# Patient Record
Sex: Female | Born: 1970 | Race: Black or African American | Hispanic: No | Marital: Married | Smoking: Never smoker
Health system: Southern US, Community
[De-identification: ages and names within clinical notes are randomized; demographics above are authoritative.]

## PROBLEM LIST (undated history)

## (undated) DIAGNOSIS — E079 Disorder of thyroid, unspecified: Secondary | ICD-10-CM

## (undated) HISTORY — PX: ABDOMINAL HYSTERECTOMY: SHX81

---

## 2005-12-12 ENCOUNTER — Inpatient Hospital Stay (HOSPITAL_COMMUNITY): Admission: RE | Admit: 2005-12-12 | Discharge: 2005-12-14 | Payer: Self-pay | Admitting: *Deleted

## 2009-07-08 ENCOUNTER — Encounter (INDEPENDENT_AMBULATORY_CARE_PROVIDER_SITE_OTHER): Payer: Self-pay | Admitting: Gynecology

## 2009-07-08 ENCOUNTER — Ambulatory Visit (HOSPITAL_BASED_OUTPATIENT_CLINIC_OR_DEPARTMENT_OTHER): Admission: RE | Admit: 2009-07-08 | Discharge: 2009-07-09 | Payer: Self-pay | Admitting: Gynecology

## 2009-11-15 ENCOUNTER — Emergency Department (HOSPITAL_COMMUNITY): Admission: EM | Admit: 2009-11-15 | Discharge: 2009-11-16 | Payer: Self-pay | Admitting: Emergency Medicine

## 2010-07-02 ENCOUNTER — Emergency Department (HOSPITAL_COMMUNITY): Admission: EM | Admit: 2010-07-02 | Discharge: 2010-07-02 | Payer: Self-pay | Admitting: Emergency Medicine

## 2011-03-12 LAB — HEPATITIS PANEL, ACUTE
Hep B C IgM: NEGATIVE
Hepatitis B Surface Ag: NEGATIVE

## 2011-03-12 LAB — ALT: ALT: 17 U/L (ref 0–35)

## 2011-03-12 LAB — HIV ANTIBODY (ROUTINE TESTING W REFLEX): HIV: NONREACTIVE

## 2011-03-12 LAB — AST: AST: 20 U/L (ref 0–37)

## 2011-04-02 LAB — HEMOGLOBIN AND HEMATOCRIT, BLOOD
HCT: 36.2 % (ref 36.0–46.0)
Hemoglobin: 12.3 g/dL (ref 12.0–15.0)

## 2011-04-02 LAB — POCT HEMOGLOBIN-HEMACUE: Hemoglobin: 13.8 g/dL (ref 12.0–15.0)

## 2011-05-09 NOTE — Op Note (Signed)
[name], [name]            ACCOUNT NO.:  [id number]   MEDICAL RECORD NO.:  [id number]          PATIENT TYPE:  AMB   LOCATION:  [facility]                         FACILITY:  [facility]   PHYSICIAN:  Gretta Cool, M.D. DATE OF BIRTH:  Aug 27, 1971   DATE OF PROCEDURE:  07/08/2009  DATE OF DISCHARGE:                               OPERATIVE REPORT   PREOPERATIVE DIAGNOSES:  1. 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  2. Bladder adherent inside the cesarean section scar by ultrasound.   POSTOPERATIVE DIAGNOSES:  1. 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  2. Bladder adherent inside the cesarean section scar by ultrasound.  3. Severe adhesions of bladder to uterus and uterus to anterior      abdominal wall.   ANESTHESIA:  General orotracheal.   ASSISTANT:  Miguel Aschoff, M.D.   DESCRIPTION OF PROCEDURE:  Under excellent general anesthesia with the  patient prepped and draped in the supine position and Foley catheter  draining the bladder, a Pfannenstiel incision was made by excision of  her 2 previous cesarean section scars.  There was a large divot in the  anterior abdominal wall, the site of previous disruption of her C-  section incision done elsewhere.  She had extensive scarring with having  had the incision packed open for several weeks.  Decision was made to  remove the scarred area.  That subcutaneous tissue with extensive  scarring was removed down to the fascia.  The subcutaneous tissue was  then sufficiently mobilized to prevent tension on the closure.  At this  point, the fascia was opened and the incision extended through the  fascia.  The rectus fascia was then dissected free from the muscle.  There were dense adhesions from her 2 previous cesarean sections and  postoperative infection.  The peritoneum was opened.  The uterus was  identified, attached  to the left underbelly of the rectus muscle high in  the abdominal wall near the umbilicus.  The uterus was freed from the  peritoneum by blunt and sharp dissection.  As the uterus was mobilized,  the ovaries and tubes appeared normal.  There were no other  abnormalities in the abdomen or pelvic cavity.  Bleeding was well  controlled.  The omentum was adherent but removed without difficulty.  Careful examination of the cesarean section scar revealed the same  finding as suspected by ultrasound previously, with the bladder tucked  into the cesarean section scar.  It was mobilized by removing some of  the muscle wall of the uterus so as not to injure the bladder.  The  bladder was therefore extracted successfully without any evidence of  injury.  There was some endometrium on the bladder wall that was treated  by cautery.  At this point, attention was turned to the hysterectomy.  There were significant adhesions on the right side and bleeding  encountered with releasing them to the right ovary.  The right ovary and  fallopian tube were  removed along with the supracervical hysterectomy.  The round ligaments were transected by cautery.  The anterior leaf of  the broad ligament was pushed far off the low uterine segment.  The  infundibulopelvic ligament on the right was then clamped, cut, sutured,  and tied with 0 Vicryl and then doubly tied with a second free tie of 0  Vicryl.  The left ovarian ligament was clamped, cut, sutured, and tied  with 0 Vicryl and then doubly ligated, also with 0 Vicryl.  The uterine  vessel was skeletonized, clamped, cut, sutured, and tied with 0 Vicryl.  The cardinal ligaments were then clamped across the upper portion.  The  pedicle suture then tied.  The cervix was then incised in the manner of  an inverted cone so as to remove most the endocervical canal.  The  remaining endocervical canal was then treated by cautery.  The cervix  was then closed with a running  suture transversely.  At this point, all  of the pedicles and pelvic floor was dry.  The pelvis was irrigated and  all debris removed.  The pelvic peritoneum was then plicated with a  running suture of 2-0 Monocryl.  At this point, the packs and retractors  were removed.  The abdominal peritoneum was likewise closed with a  running suture of 0 Monocryl with careful attention to exteriorize the  previous scarring area on the anterior abdominal wall where the uterus  had been adherent from previous cesarean section.  At this point, the  rectus muscles were plicated in the midline.  The fascia was  approximated with a running suture of 0 Vicryl from each angle of the  midline.  The subcutaneous tissue was approximated by approximating  Scarpa's fascia with interrupted sutures of 3-0 Vicryl.  The skin was  then closed with skin staples and Steri-Strips and also mattress sutures  for Novofil so as to prevent torque on the incision and inversion of the  skin edges.  At this point, the procedure was terminated without  complication.  The patient returned to the recovery room in excellent  condition.           ______________________________  Gretta Cool, M.D.     CWL/MEDQ  D:  07/08/2009  T:  07/08/2009  Job:  [id number]   cc:   Juliann Pares  Fax: [phone]   Miguel Aschoff, M.D.

## 2011-05-12 NOTE — Op Note (Signed)
NAMESHIRLA, Susan [name]            ACCOUNT NO.:  [id number]   MEDICAL RECORD NO.:  [id number]          PATIENT TYPE:  INP   LOCATION:  9141                          FACILITY:  [facility]   PHYSICIAN:  Gerri Spore B. Earlene Plater, M.D.  DATE OF BIRTH:  12-16-1971   DATE OF PROCEDURE:  12/12/2005  DATE OF DISCHARGE:                                 OPERATIVE REPORT   PREOPERATIVE DIAGNOSES:  1.  Thirty-nine weeks intrauterine pregnancy.  2.  Previous cesarean section for repeat.   POSTOPERATIVE DIAGNOSES:  1.  Thirty-nine weeks intrauterine pregnancy.  2.  Previous cesarean section for repeat.   OPERATION/PROCEDURE:  Repeat low transverse cesarean section.   SURGEON:  Chester Holstein. Earlene Plater, M.D.   ASSISTANT:  Lenoard Aden, M.D.   ANESTHESIA:  Spinal.   SPECIMENS:  None.   ESTIMATED BLOOD LOSS:  800.   COMPLICATIONS:  None.   OPERATIVE FINDINGS:  Bladder adherent to the uterus; therefore, ultimately  tubes and ovaries were not well seen.  There was a 2-3 cm intramural myoma  in the lower uterine segment.  Viable female, Apgars 8 and 9, weight 7  pounds 8 ounces.  The lie was oblique.   INDICATIONS:  The patient with a history of a previous cesarean section for  repeat.  Risks of surgery were discussed including infection, bleeding,  damage to surrounding organs.   DESCRIPTION OF PROCEDURE:  The patient was taken to the operating room and  spinal anesthesia was obtained.  She was prepped and draped in the standard  fashion and Foley catheter inserted into the bladder.  A Pfannenstiel  incision was made through right previous scar and carried sharply to the  fascia. The fascia was divided sharply and elevated with Kocher clamps.  Underlying rectus muscles were dissected off sharply.   The bladder was noted to be adherent well up onto the uterus.  Therefore,  dissection was continued cephalad to get above the bladder and the  peritoneal cavity entered sharply.  The bladder adhesions were  taken down  sharply.  Bladder was inspected and no injury was sustained.   The bladder blade was inserted.  Uterine incision made in the low transverse  fashion with a knife.  Clear fluid on amniotomy.  The incision extended  laterally with bandage scissors.  The lie of the fetus was oblique with  vertex towards the maternal right hip and the left shoulder essentially at  the presenting part.  The vertex was rotated into the incision with the  assistance of the fundal pressure.  The Kiwi vacuum was placed on the  flexion point and from the mid green zone the vertex delivered out of the  uterus without difficulty.  Nose and mouth suctioned with the bulb.  The  remainder of the infant delivered without difficulty.  The cord was clamped  and cut.  Infant handed off to the awaiting pediatricians.  One gram of  Ancef was given at cord clamp.   Placenta was removed by uterine massage.  The uterus was left in situ due to  the adhesions.  The incision was inspected and was  free of extension.  The  uterine incision was closed with running locked stitches of 0 chromic.  Second imbricating layer placed with hemostasis obtained.  Peritoneum was  reapproximated with 3-0 chromic.  The fascia was closed with a running  stitch of 0 Vicryl.  Subcutaneous tissue was irrigated and made hemostatic  and skin was closed with staples.  The patient tolerated the procedure well  without complications.  She was taken to the recovery room awake, alert, in  stable condition.  All counts correct by the operating room staff.      Gerri Spore B. Earlene Plater, M.D.  Electronically Signed     WBD/MEDQ  D:  12/12/2005  T:  12/12/2005  Job:  [id number]

## 2011-05-12 NOTE — Discharge Summary (Signed)
NAMENOELLY, LASSEIGNE            ACCOUNT NO.:  [id number]   MEDICAL RECORD NO.:  [id number]          PATIENT TYPE:  INP   LOCATION:  9141                          FACILITY:  [facility]   PHYSICIAN:  Gerri Spore B. Earlene Plater, M.D.  DATE OF BIRTH:  1971-06-04   DATE OF ADMISSION:  12/12/2005  DATE OF DISCHARGE:  12/14/2005                                 DISCHARGE SUMMARY   ADMITTING DIAGNOSES:  1.  39-week pregnancy.  2.  Previous cesarean section.   POSTOPERATIVE DIAGNOSES:  1.  39-week pregnancy.  2.  Previous cesarean section.   PROCEDURE:  Repeat low transverse cesarean section.   OPERATIVE FINDINGS:  Viable female.  Apgars 8/9.  Weight 7 pounds 8 ounces.  Bladder was significantly adherent to the uterus.  Tubes and ovaries were  not well visualized due to adhesions in the upper abdomen.  Also, a 3 cm  lower uterine segment fibroid.   HOSPITAL COURSE:  Patient was admitted at 39+ weeks for history of previous  cesarean section for repeat.  Patient underwent repeat cesarean section as  outlined above.  The adhesions were encountered as outlined above.  See the  operative note for further details.   Postoperatively patient rapidly regained her ability to ambulate, void, and  tolerate a regular diet.  She was discharged home on the second  postoperative day in satisfactory condition.   DISCHARGE INSTRUCTIONS:  Standard instructions given prior to dismissal.   DISCHARGE MEDICATIONS:  Tylox one to two tablets every four to six hours as  needed for pain.   FOLLOW-UP:  [facility], Dr. Earlene Plater one month.   DISPOSITION:  Satisfactory.      Gerri Spore B. Earlene Plater, M.D.  Electronically Signed     WBD/MEDQ  D:  12/29/2005  T:  12/29/2005  Job:  [id number]

## 2014-12-25 HISTORY — PX: THYROID SURGERY: SHX805

## 2019-08-20 ENCOUNTER — Other Ambulatory Visit: Payer: Self-pay | Admitting: Family Medicine

## 2019-08-20 ENCOUNTER — Ambulatory Visit
Admission: RE | Admit: 2019-08-20 | Discharge: 2019-08-20 | Disposition: A | Discharge status: Home / Self Care | Payer: 59 | Source: Ambulatory Visit | Attending: Family Medicine | Admitting: Family Medicine

## 2019-08-20 ENCOUNTER — Other Ambulatory Visit: Payer: Self-pay

## 2019-08-20 DIAGNOSIS — Z1231 Encounter for screening mammogram for malignant neoplasm of breast: Secondary | ICD-10-CM

## 2019-09-18 ENCOUNTER — Emergency Department (HOSPITAL_COMMUNITY): Payer: 59

## 2019-09-18 ENCOUNTER — Encounter (HOSPITAL_COMMUNITY): Payer: Self-pay | Admitting: Emergency Medicine

## 2019-09-18 ENCOUNTER — Other Ambulatory Visit: Payer: Self-pay

## 2019-09-18 ENCOUNTER — Emergency Department (HOSPITAL_COMMUNITY)
Admission: EM | Admit: 2019-09-18 | Discharge: 2019-09-19 | Disposition: A | Discharge status: Home / Self Care | Payer: 59 | Attending: Emergency Medicine | Admitting: Emergency Medicine

## 2019-09-18 DIAGNOSIS — U071 COVID-19: Secondary | ICD-10-CM | POA: Diagnosis not present

## 2019-09-18 DIAGNOSIS — R0602 Shortness of breath: Secondary | ICD-10-CM | POA: Diagnosis present

## 2019-09-18 DIAGNOSIS — E039 Hypothyroidism, unspecified: Secondary | ICD-10-CM | POA: Diagnosis not present

## 2019-09-18 DIAGNOSIS — J80 Acute respiratory distress syndrome: Secondary | ICD-10-CM | POA: Insufficient documentation

## 2019-09-18 DIAGNOSIS — J069 Acute upper respiratory infection, unspecified: Secondary | ICD-10-CM

## 2019-09-18 MED ORDER — DEXAMETHASONE 6 MG PO TABS: 6.0000 mg | ORAL_TABLET | Freq: Every day | ORAL | 0 refills | Status: AC

## 2019-09-18 NOTE — ED Triage Notes (Signed)
Pt reports having increasing shortness of breath and was recently dx with COVID-19. Pt reports productive cough clear/yellow in color. Pt reports taking Ibuprofen at 1900 tonight for fever.

## 2019-09-18 NOTE — Discharge Instructions (Addendum)
You have tested positive for COVID-19 virus.  Please continue to quarantine at home and monitor your symptoms closely. You chest x-ray shows some mild infiltrates bilaterally which is consistent with COVID. Antibiotics are not helpful in treating viral infection, the virus should run its course in about 14 days. Please make sure you are drinking plenty of fluids. You can treat your symptoms supportively with tylenol for fevers and pains, and over the counter cough syrups and throat lozenges to help with cough. If your symptoms are not improving please follow up with you Primary doctor.   I recommend that you purchase a home pulse ox to help better monitor your oxygen at home, if you start to have increased work of breathing or shortness of breath or your oxygen drops below 90% please immediately return to the hospital for reevaluation.  If you develop persistent fevers, shortness of breath or difficulty breathing, chest pain, severe headache and neck pain, persistent nausea and vomiting or other new or concerning symptoms return to the Emergency department.

## 2019-09-18 NOTE — ED Notes (Signed)
Pt ambulated in room to assess oxygen saturation and pt maintained at 95-96% on room air.

## 2019-09-18 NOTE — ED Provider Notes (Signed)
[facility] DEPT Provider Note   CSN: [id number] Arrival date & time: 09/18/19  2112     History   Chief Complaint Chief Complaint  Patient presents with  . Shortness of Breath    HPI Susan [name] is a 48 y.o. female.     Susan [name] is a 48 y.o. female with history of hypothyroidism s/p thyroidectomy, and abdominal hysterectomy, who presents to the ED with known COVID-19 infection, with worsening shortness of breath.  Patient was diagnosed with COVID on 9/20 after patient was experiencing 4 days of cough and shortness of breath, she saw her PCP and had positive rapid COVID test.  She reports associated diarrhea, she was initially having some nausea but this has improved and she is able to eat and drink.  She reports loss of taste and smell and some intermittent sore throat.  Has been taking Tylenol, over-the-counter cough medication and today her PCP called in an albuterol inhaler for her which has been very helpful.  She reports worsening shortness of breath, but denies any chest pain.  No other aggravating or alleviating factors.     History reviewed. No pertinent past medical history.  There are no active problems to display for this patient.   Past Surgical History:  Procedure Laterality Date  . ABDOMINAL HYSTERECTOMY    . THYROID SURGERY  2016     OB History   No obstetric history on file.      Home Medications    Prior to Admission medications   Medication Sig Start Date End Date Taking? Authorizing Provider  dexamethasone (DECADRON) 6 MG tablet Take 1 tablet (6 mg total) by mouth daily for 10 days. 09/18/19 09/28/19  Jacqlyn Larsen, PA-C    Family History History reviewed. No pertinent family history.  Social History Social History   Tobacco Use  . Smoking status: Never Smoker  . Smokeless tobacco: Never Used  Substance Use Topics  . Alcohol use: Never    Frequency: Never  . Drug use: Never     Allergies    Codeine   Review of Systems Review of Systems  Constitutional: Positive for chills, fatigue and fever.  HENT: Positive for congestion and sore throat.   Respiratory: Positive for cough and shortness of breath.   Cardiovascular: Negative for chest pain.  Gastrointestinal: Positive for diarrhea and nausea. Negative for abdominal pain and vomiting.  Genitourinary: Negative for dysuria and frequency.  Musculoskeletal: Positive for myalgias.  Skin: Negative for color change and rash.  Neurological: Negative for dizziness, syncope and light-headedness.     Physical Exam Updated Vital Signs BP 124/79 (BP Location: Right Arm)   Pulse 100   Temp 100.3 F (37.9 C) (Oral)   Resp 20   Ht 5\' 2"  (1.575 m)   Wt 80.3 kg   SpO2 95%   BMI 32.37 kg/m   Physical Exam Vitals signs and nursing note reviewed.  Constitutional:      General: She is not in acute distress.    Appearance: She is well-developed and normal weight. She is not ill-appearing or diaphoretic.  HENT:     Head: Normocephalic and atraumatic.     Mouth/Throat:     Mouth: Mucous membranes are moist.     Pharynx: Oropharynx is clear.     Comments: Posterior oropharynx clear and mucous membranes moist, there is mild erythema but no edema or tonsillar exudates, uvula midline, normal phonation, no trismus, tolerating secretions without difficulty. Eyes:  General:        Right eye: No discharge.        Left eye: No discharge.  Neck:     Musculoskeletal: Neck supple.     Comments: No rigidity Cardiovascular:     Rate and Rhythm: Normal rate and regular rhythm.     Heart sounds: Normal heart sounds.  Pulmonary:     Effort: Pulmonary effort is normal. No respiratory distress.     Breath sounds: Rales present.     Comments: Patient breathing comfortably on room air, satting at 96%, able to speak in full sentences, no increased work of breathing.  Lungs with good air movement throughout some faint crackles in bilateral  bases, no wheezing or rhonchi. Chest:     Chest wall: No tenderness.  Abdominal:     General: Bowel sounds are normal. There is no distension.     Palpations: Abdomen is soft. There is no mass.     Tenderness: There is no abdominal tenderness. There is no guarding.     Comments: Abdomen soft, nondistended, nontender to palpation in all quadrants without guarding or peritoneal signs  Musculoskeletal:        General: No deformity.     Right lower leg: She exhibits no tenderness. No edema.     Left lower leg: She exhibits no tenderness. No edema.  Lymphadenopathy:     Cervical: No cervical adenopathy.  Skin:    General: Skin is warm and dry.     Capillary Refill: Capillary refill takes less than 2 seconds.  Neurological:     Mental Status: She is alert and oriented to person, place, and time.  Psychiatric:        Mood and Affect: Mood normal.        Behavior: Behavior normal.      ED Treatments / Results  Labs (all labs ordered are listed, but only abnormal results are displayed) Labs Reviewed - No data to display  EKG None  [facility]  Result Date: 09/18/2019 CLINICAL DATA:  48 year old female with shortness of breath. COVID-19. EXAM: PORTABLE CHEST 1 VIEW COMPARISON:  Chest radiograph dated 11/16/2009 FINDINGS: Shallow inspiration. Faint bibasilar and right peripheral/subpleural densities concerning for infiltrate, likely atypical or viral etiology. Clinical correlation is recommended. There is no pleural effusion or pneumothorax. The cardiac silhouette is within normal limits. No acute osseous pathology. IMPRESSION: Bilateral subpleural infiltrates. Electronically Signed   By: Elgie CollardArash  Radparvar M.D.   On: 09/18/2019 23:21    Procedures Procedures (including critical care time)  Medications Ordered in ED Medications - No data to display   Initial Impression / Assessment and Plan / ED Course  I have reviewed the triage vital signs and the nursing  notes.  Pertinent labs & imaging results that were available during my care of the patient were reviewed by me and considered in my medical decision making (see chart for details).  48 year old female with known COVID-19 infection presents with worsening shortness of breath.  Diagnosed with COVID 5 days ago after 4 days of productive cough and fever.  On arrival vitals normal, low-grade fever, satting well on room air.  On my evaluation she is overall well-appearing, breathing comfortably with no distress.  Lungs with some faint crackles in bilateral bases but otherwise clear.  Ambulated in room with no oxygen desaturation.  Chest x-ray shows bilateral lower lobe infiltrates most consistent with pneumonia.  Patient has already been prescribed albuterol inhaler will start patient  on 10-day course of Decadron.  She already has a home pulse ox, I have given her strict return precautions.  Discharged home in good condition.  Final Clinical Impressions(s) / ED Diagnoses   Final diagnoses:  Acute respiratory disease due to COVID-19 virus    ED Discharge Orders         Ordered    dexamethasone (DECADRON) 6 MG tablet  Daily     09/18/19 2352           Dartha Lodge, [place] 09/19/19 0107    Jacalyn Lefevre, MD 09/22/19 [phone]

## 2019-12-01 IMAGING — DX DG CHEST 1V PORT
1 series · 1 of 1 positions shown · non-contrast
Comparison: Chest radiograph dated 11/16/2009

CLINICAL DATA: 48-year-old female with shortness of breath.
[id number].

EXAM:
PORTABLE CHEST 1 VIEW

[chest ap]
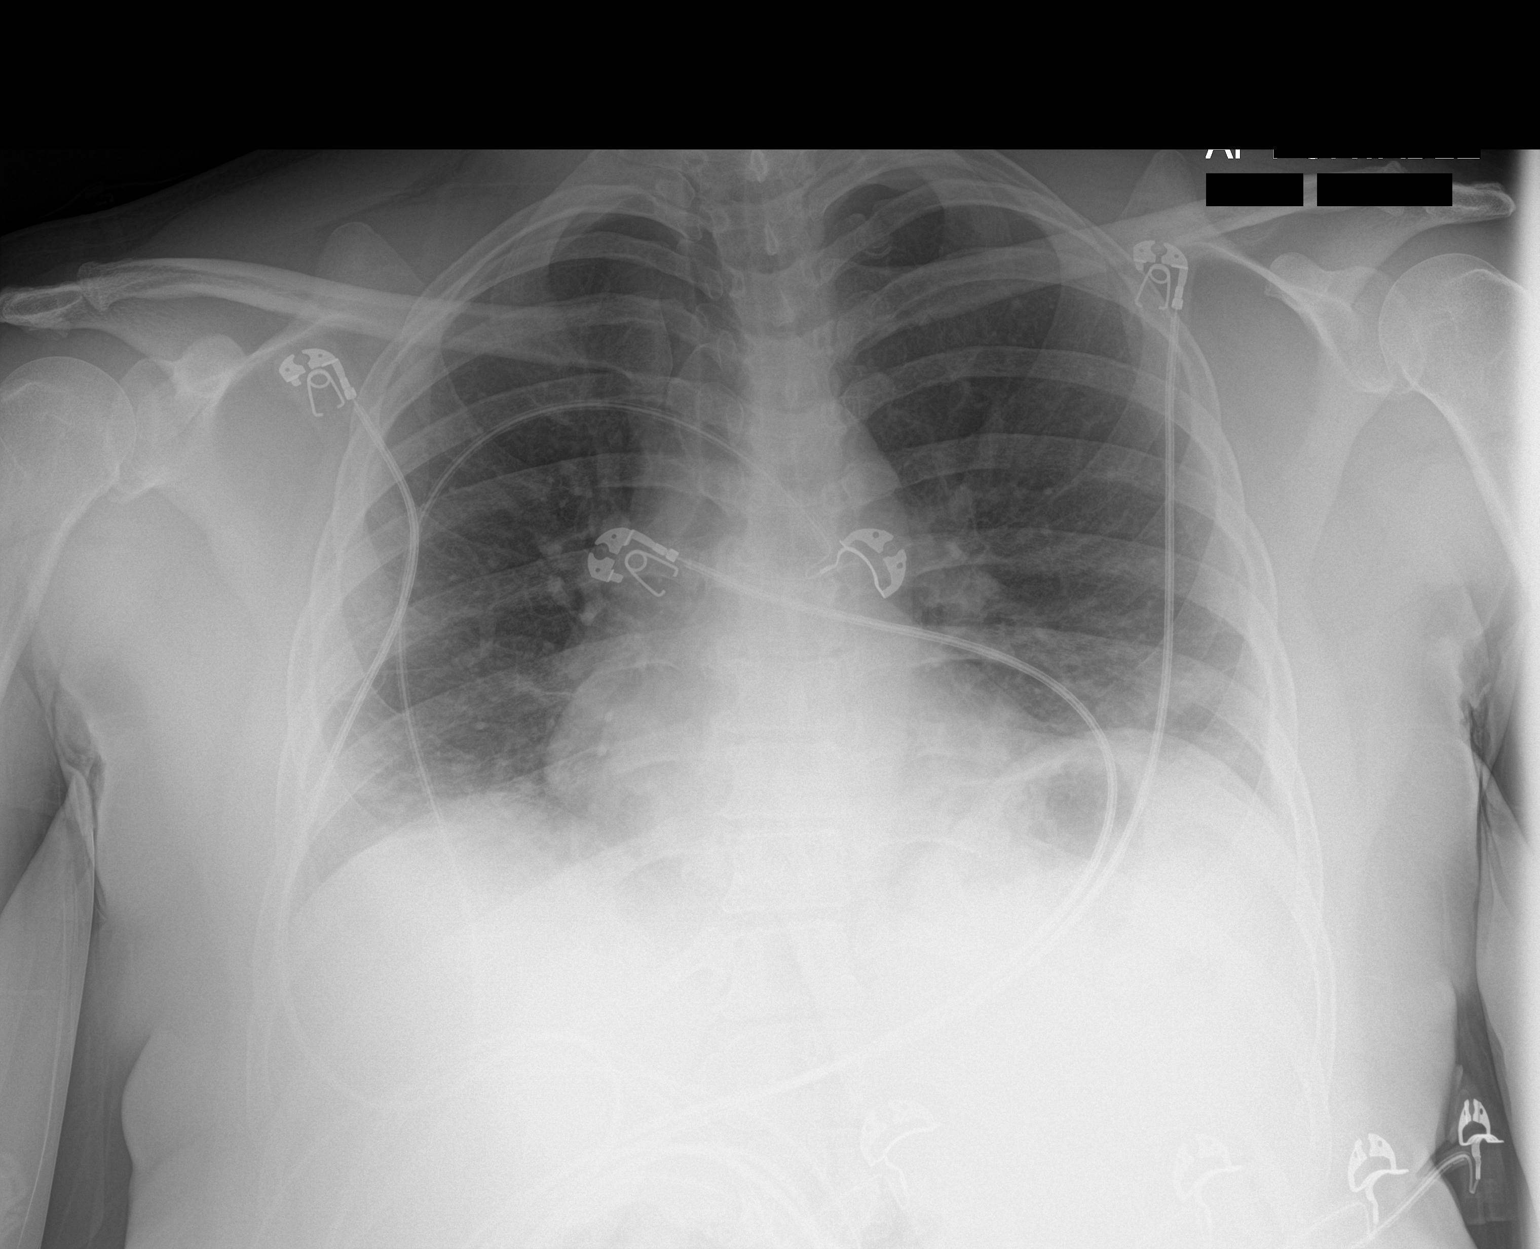

[1 of 1 positions shown; findings below may reference images not displayed]

FINDINGS: Shallow inspiration. Faint bibasilar and right peripheral/subpleural
densities concerning for infiltrate, likely atypical or viral
etiology. Clinical correlation is recommended. There is no pleural
effusion or pneumothorax. The cardiac silhouette is within normal
limits. No acute osseous pathology.
IMPRESSION: Bilateral subpleural infiltrates.

## 2021-12-07 ENCOUNTER — Emergency Department
Admission: EM | Admit: 2021-12-07 | Discharge: 2021-12-07 | Disposition: A | Discharge status: Home / Self Care | Payer: No Typology Code available for payment source | Source: Home / Self Care | Attending: Family Medicine | Admitting: Family Medicine

## 2021-12-07 ENCOUNTER — Other Ambulatory Visit: Payer: Self-pay

## 2021-12-07 ENCOUNTER — Emergency Department (INDEPENDENT_AMBULATORY_CARE_PROVIDER_SITE_OTHER): Payer: No Typology Code available for payment source

## 2021-12-07 DIAGNOSIS — M79662 Pain in left lower leg: Secondary | ICD-10-CM

## 2021-12-07 DIAGNOSIS — M25462 Effusion, left knee: Secondary | ICD-10-CM

## 2021-12-07 DIAGNOSIS — M25562 Pain in left knee: Secondary | ICD-10-CM

## 2021-12-07 HISTORY — DX: Disorder of thyroid, unspecified: E07.9

## 2021-12-07 MED ORDER — MELOXICAM 15 MG PO TABS: ORAL_TABLET | ORAL | 3 refills | Status: DC

## 2021-12-07 NOTE — ED Notes (Signed)
Updated pt on plan of care

## 2021-12-07 NOTE — Consult Note (Signed)
° ° °  Procedures performed today:    None.  Independent interpretation of notes and tests performed by another provider:   X-rays and ultrasound personally reviewed, minimal to no degenerative changes on x-ray, ultrasound does show some hypoechoic change posterior to the lateral femoral condyle, likely simple effusion from the joint.  Brief History, Exam, Impression, and Recommendations:    Left knee pain This is a pleasant 50 year old female, she works as a Armed forces operational officer, she is had a couple of weeks of pain in the left knee, posterior/lateral aspect, worse at night, moderate gelling, no mechanical symptoms, no trauma. She was seen in urgent care, x-rays were for the most part unrevealing, ultrasound showed a fluid collection posterior/lateral joint line, not consistent with a Baker's cyst. On examination she has a trace effusion, tenderness at the posterior/lateral joint line with some fullness, nothing where we would expect a Baker's cyst to be. Pain with terminal flexion, positive Thessaly sign, ligamentous structures intact. That she has some osteoarthritis with likely degenerative meniscal injury, she has meloxicam at home, she will do 15 mg daily with food for 2 weeks and then as needed, home conditioning given, return to see me in 4 weeks, MRI/injection if no better.    ___________________________________________ Ihor Austin. Benjamin Stain, M.D., ABFM., CAQSM. Primary Care and Sports Medicine [facility]  Adjunct Instructor of Family Medicine  [employer]

## 2021-12-07 NOTE — ED Provider Notes (Signed)
[facility]    CSN: [id number] Arrival date & time: 12/07/21  0813      History   Chief Complaint Chief Complaint  Patient presents with   Leg Pain    HPI Susan [name] is a 50 y.o. female.   HPI  Patient complains of leg pain for 2 weeks.  She states she feels a clicking in her knee.  She has had some swelling around her knee.  She has swelling in the back of her knee that extends down into her calf.  She is worried about a blood clot.  She has had no accident or injury.  Increased pain with ambulation.  Pain with movement of knee.  No accident or injury.  No overuse.  States she used to exercise a lot when she was in [employer] but does not now.  Past Medical History:  Diagnosis Date   Thyroid disease     Patient Active Problem List   Diagnosis Date Noted   Left knee pain 12/07/2021    Past Surgical History:  Procedure Laterality Date   ABDOMINAL HYSTERECTOMY     THYROID SURGERY  2016    OB History   No obstetric history on file.      Home Medications    Prior to Admission medications   Medication Sig Start Date End Date Taking? Authorizing Provider  meloxicam (MOBIC) 15 MG tablet One tab PO qAM with a meal for 2 weeks, then daily prn pain. 12/07/21  Yes Monica Becton, MD  SYNTHROID 100 MCG tablet Take 100 mcg by mouth daily. 09/29/21   [provider]    Family History History reviewed. No pertinent family history.  Social History Social History   Tobacco Use   Smoking status: Never   Smokeless tobacco: Never  Substance Use Topics   Alcohol use: Never   Drug use: Never     Allergies   Codeine   Review of Systems Review of Systems  See HPI Physical Exam Triage Vital Signs ED Triage Vitals  Enc Vitals Group     BP 12/07/21 0831 119/79     Pulse --      Resp 12/07/21 0831 14     Temp 12/07/21 0831 98.2 F (36.8 C)     Temp Source 12/07/21 0831 Oral     SpO2 12/07/21 0831 96 %     Weight 12/07/21 0827  177 lb (80.3 kg)     Height 12/07/21 0827 5\' 2"  (1.575 m)     Head Circumference --      Peak Flow --      Pain Score 12/07/21 0827 6     Pain Loc --      Pain Edu? --      Excl. in GC? --    No data found.  Updated Vital Signs BP 119/79 (BP Location: Right Arm)    Temp 98.2 F (36.8 C) (Oral)    Resp 14    Ht 5\' 2"  (1.575 m)    Wt 80.3 kg    SpO2 96%    BMI 32.37 kg/m      Physical Exam Constitutional:      General: She is not in acute distress.    Appearance: She is well-developed.  HENT:     Head: Normocephalic and atraumatic.  Eyes:     Conjunctiva/sclera: Conjunctivae normal.     Pupils: Pupils are equal, round, and reactive to light.  Cardiovascular:     Rate and  Rhythm: Normal rate.  Pulmonary:     Effort: Pulmonary effort is normal. No respiratory distress.  Abdominal:     General: There is no distension.     Palpations: Abdomen is soft.  Musculoskeletal:        General: Normal range of motion.     Cervical back: Normal range of motion.     Comments: The left knee has effusion.  Full extension but lacks full flexion.  No instability to examination.  Tenderness in the posterior knee over the lateral area.  Mild tenderness over the medial joint line anteriorly.  No patellofemoral crepitus or pain with patellar mobility  Skin:    General: Skin is warm and dry.  Neurological:     Mental Status: She is alert.     Gait: Gait abnormal.     UC Treatments / Results  Labs (all labs ordered are listed, but only abnormal results are displayed) Labs Reviewed - No data to display  EKG   Radiology US Venous Img Lower Unilateral Left  Result Date: 12/07/2021 CLINICAL DATA:  Pain swelling and posterior left knee for 2 weeks. EXAM: LEFT LOWER EXTREMITY VENOUS DOPPLER ULTRASOUND TECHNIQUE: Gray-scale sonography with compression, as well as color and duplex ultrasound, were performed to evaluate the deep venous system(s) from the level of the common femoral vein through  the popliteal and proximal calf veins. COMPARISON:  None. FINDINGS: VENOUS Normal compressibility of the common femoral, superficial femoral, and popliteal veins, as well as the visualized calf veins. Visualized portions of profunda femoral vein and great saphenous vein unremarkable. No filling defects to suggest DVT on grayscale or color Doppler imaging. Doppler waveforms show normal direction of venous flow, normal respiratory plasticity and response to augmentation. Limited views of the contralateral common femoral vein are unremarkable. OTHER 3.1 x 2.0 x 0.9 cm fluid collection is noted in the area of patient's pain, posterolateral calf. Limitations: none IMPRESSION: 1. No left lower extremity DVT. 2. 3.1 x 2.0 x 0.9 cm fluid collection noted in the posterior lateral left calf in the region of patient's pain. This could represent a hematoma or abscess. Electronically Signed   By: Acquanetta Belling M.D.   On: 12/07/2021 10:58   DG Knee AP/LAT W/Sunrise Left  Result Date: 12/07/2021 CLINICAL DATA:  Posterior pain and swelling without known trauma EXAM: LEFT KNEE 3 VIEWS COMPARISON:  None. FINDINGS: No evidence of fracture, dislocation, or joint effusion. No evidence of arthropathy or other focal bone abnormality. Soft tissues are unremarkable. IMPRESSION: Negative. Electronically Signed   By: Corlis Leak M.D.   On: 12/07/2021 09:21    Procedures Procedures (including critical care time)  Medications Ordered in UC Medications - No data to display  Initial Impression / Assessment and Plan / UC Course  I have reviewed the triage vital signs and the nursing notes.  Pertinent labs & imaging results that were available during my care of the patient were reviewed by me and considered in my medical decision making (see chart for details).     Dr. Benjamin Stain was kind enough to come and evaluate the patient.  He feels she has internal derangement of the left knee consistent with meniscus tear.  He feels this  is causing the swelling in the back of any.  He is going to follow-up with her in 4 weeks Final Clinical Impressions(s) / UC Diagnoses   Final diagnoses:  Pain of left calf  Pain in joint of left knee     Discharge  Instructions      Take the meloxicam once a day Follow-up with Dr. Benjamin Stain as directed     ED Prescriptions     Medication Sig Dispense Auth. Provider   meloxicam (MOBIC) 15 MG tablet One tab PO qAM with a meal for 2 weeks, then daily prn pain. 30 tablet Monica Becton, MD      PDMP not reviewed this encounter.   Eustace Moore, MD 12/07/21 [phone]

## 2021-12-07 NOTE — ED Triage Notes (Signed)
Pt st she is having L lower leg pain for 1 week and does not remember injuring it at all. Pt st the pain has been waking her up in the middle of the night and has noticed swelling. Pt st she has been taking meloxicam and ibuprofen with no difference in pain. Pt expresses pain when extending leg. Pt st tiger balm helped some but not much.

## 2021-12-07 NOTE — Discharge Instructions (Signed)
Take the meloxicam once a day Follow-up with Dr. Benjamin Stain as directed

## 2021-12-07 NOTE — Assessment & Plan Note (Signed)
This is a pleasant 50 year old female, she works as a Armed forces operational officer, she is had a couple of weeks of pain in the left knee, posterior/lateral aspect, worse at night, moderate gelling, no mechanical symptoms, no trauma. She was seen in urgent care, x-rays were for the most part unrevealing, ultrasound showed a fluid collection posterior/lateral joint line, not consistent with a Baker's cyst. On examination she has a trace effusion, tenderness at the posterior/lateral joint line with some fullness, nothing where we would expect a Baker's cyst to be. Pain with terminal flexion, positive Thessaly sign, ligamentous structures intact. That she has some osteoarthritis with likely degenerative meniscal injury, she has meloxicam at home, she will do 15 mg daily with food for 2 weeks and then as needed, home conditioning given, return to see me in 4 weeks, MRI/injection if no better.

## 2022-02-19 IMAGING — DX DG KNEE AP/LAT W/ SUNRISE*L*
3 series · 3 of 3 positions shown · non-contrast
Comparison: None.

CLINICAL DATA: Posterior pain and swelling without known trauma

EXAM:
LEFT KNEE 3 VIEWS

[knee ap]
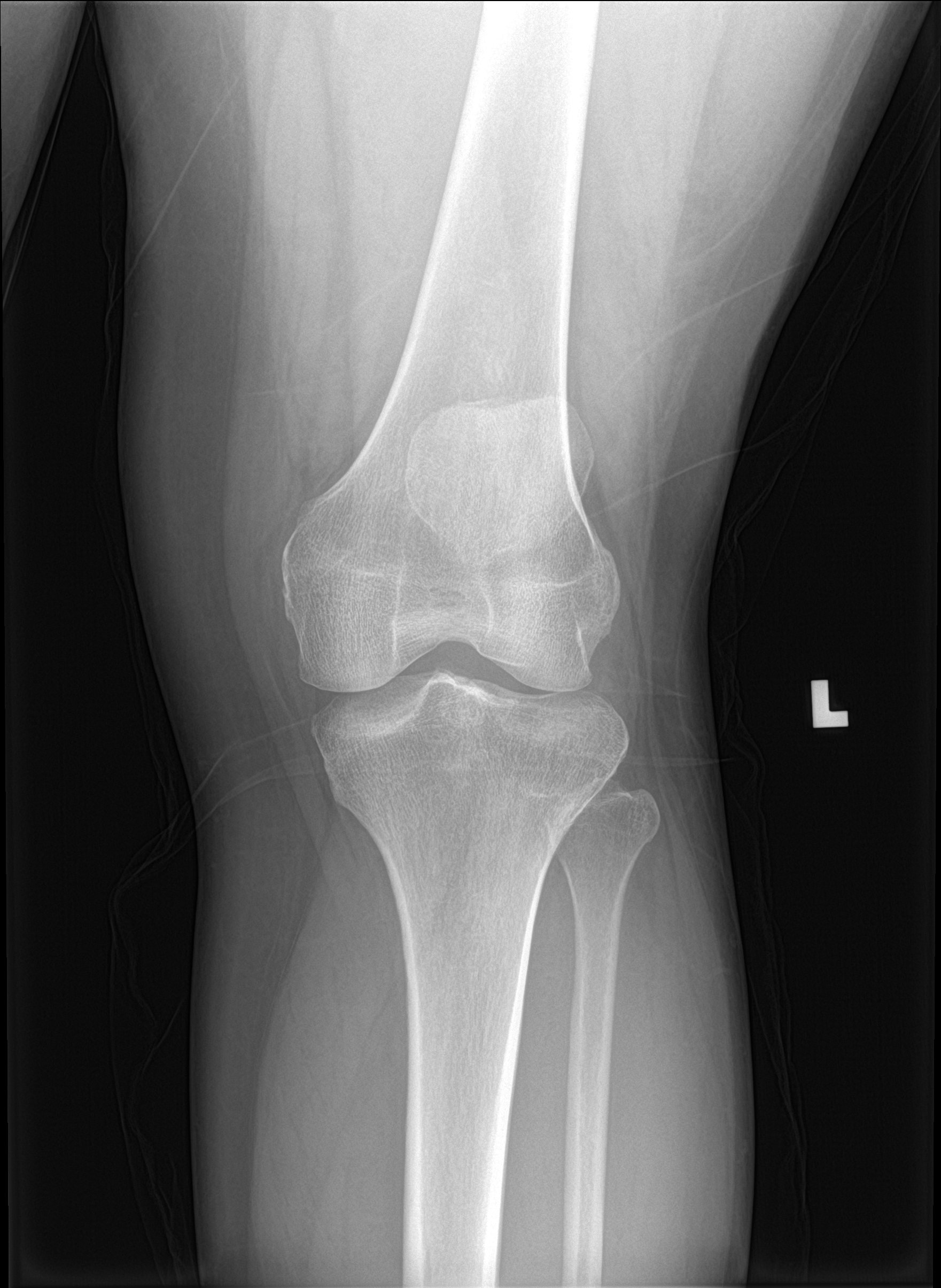

[knee lat]
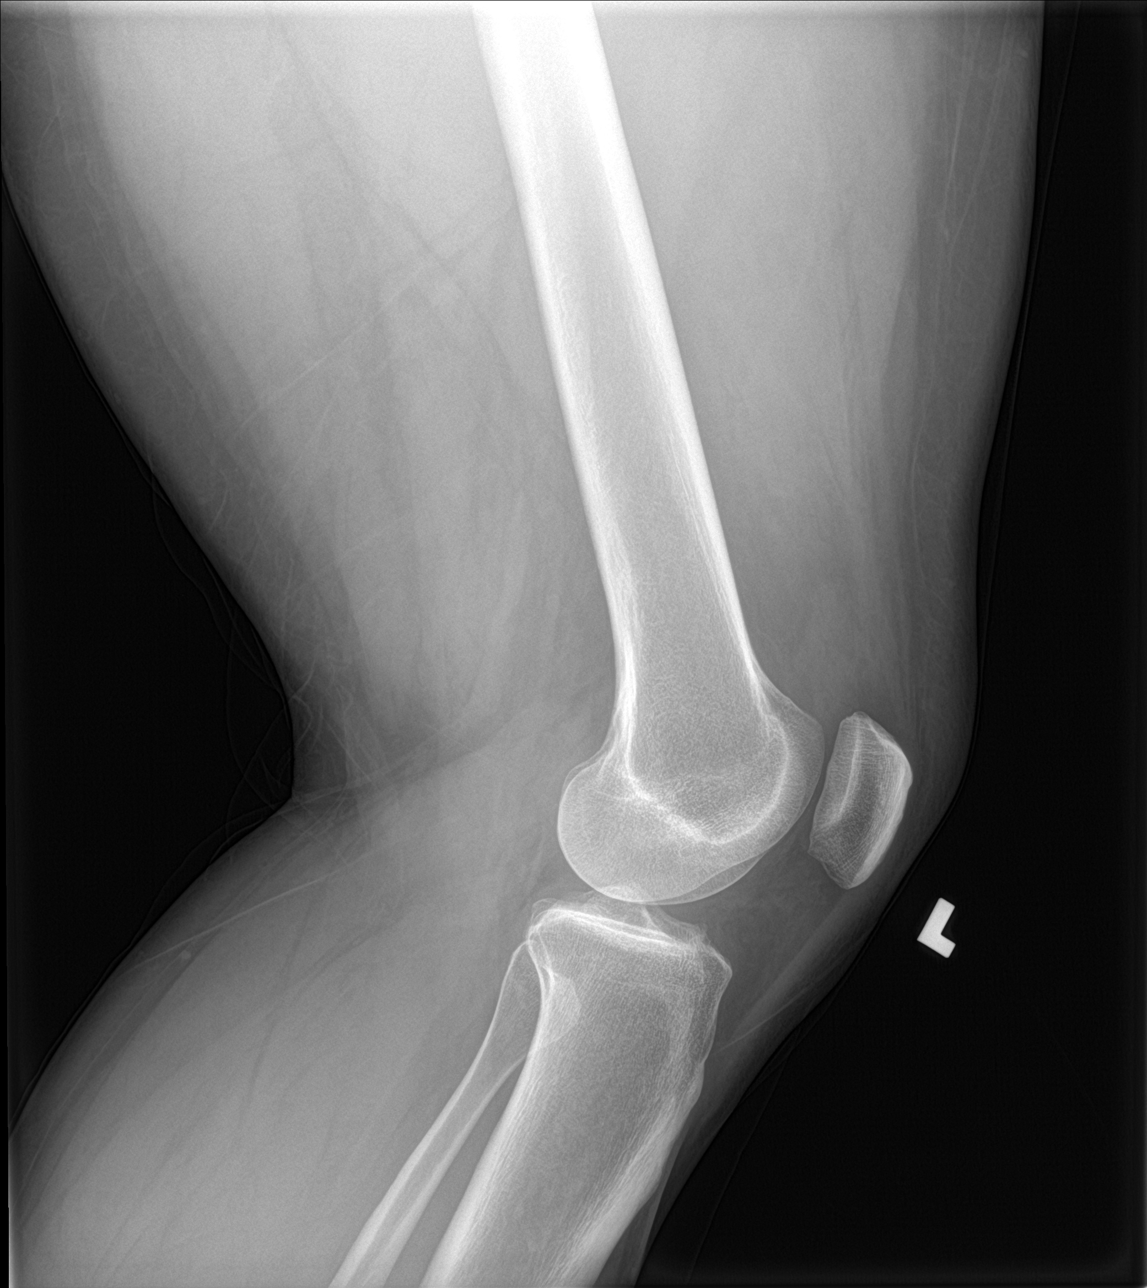

[knee sunrise standing]
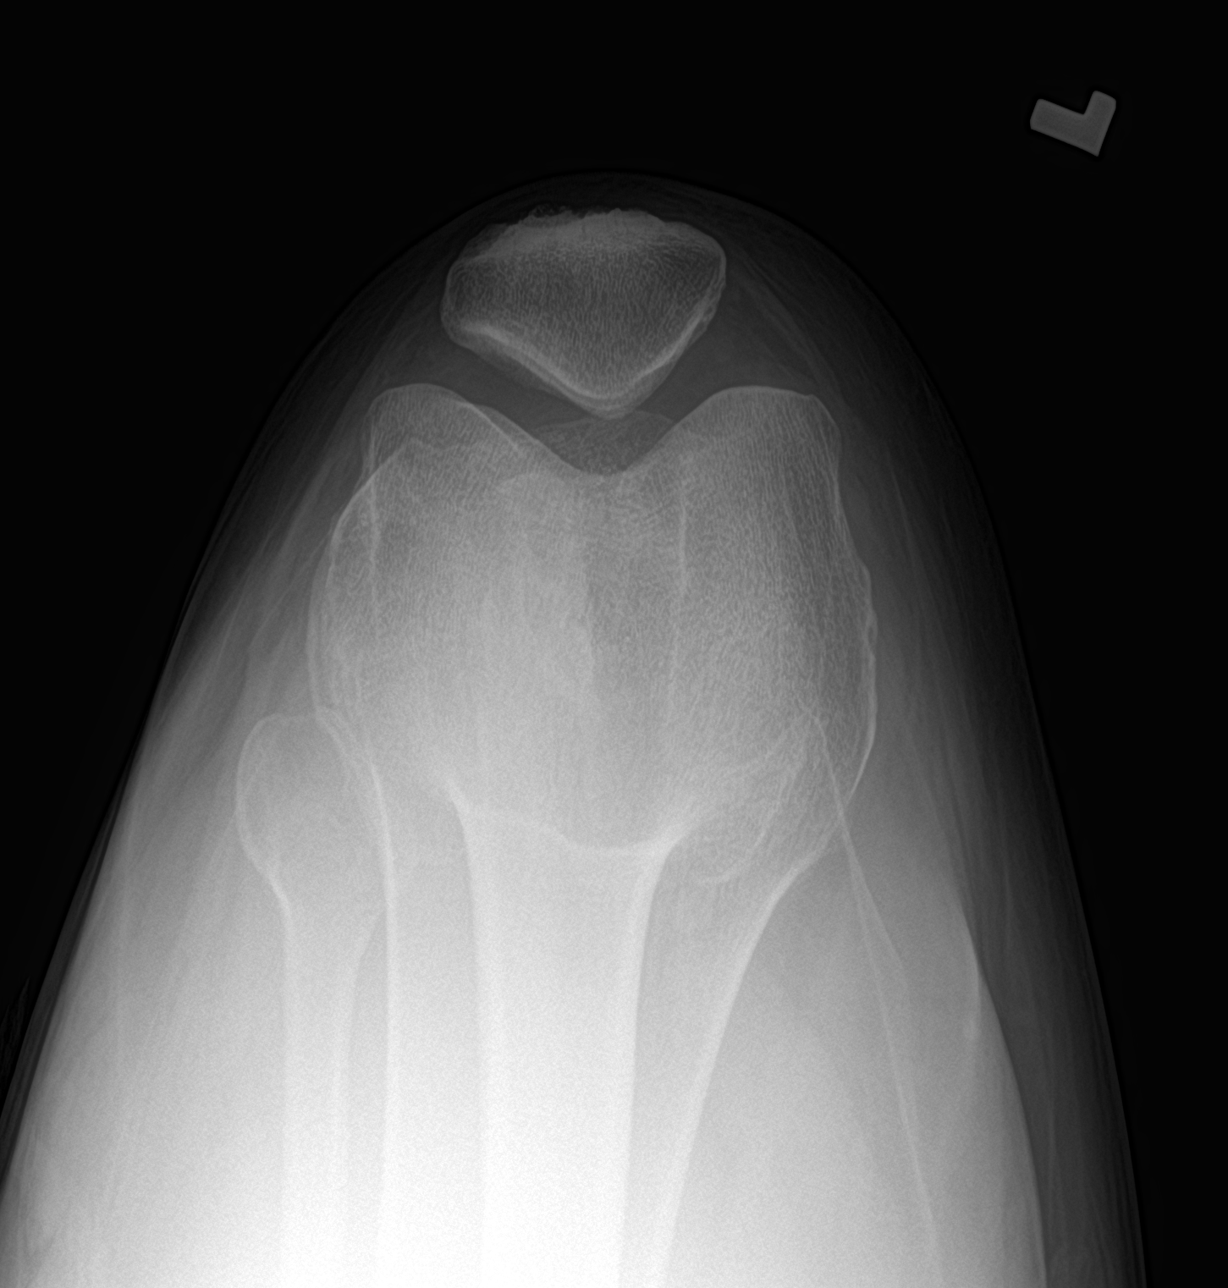

[3 of 3 positions shown; findings below may reference images not displayed]

FINDINGS: No evidence of fracture, dislocation, or joint effusion. No evidence
of arthropathy or other focal bone abnormality. Soft tissues are
unremarkable.
IMPRESSION: Negative.

## 2022-03-08 ENCOUNTER — Other Ambulatory Visit: Payer: Self-pay

## 2022-03-08 ENCOUNTER — Ambulatory Visit (INDEPENDENT_AMBULATORY_CARE_PROVIDER_SITE_OTHER): Payer: No Typology Code available for payment source | Admitting: Sports Medicine

## 2022-03-08 ENCOUNTER — Ambulatory Visit (INDEPENDENT_AMBULATORY_CARE_PROVIDER_SITE_OTHER): Payer: No Typology Code available for payment source

## 2022-03-08 DIAGNOSIS — M25562 Pain in left knee: Secondary | ICD-10-CM

## 2022-03-08 MED ORDER — CELECOXIB 200 MG PO CAPS: 200.0000 mg | ORAL_CAPSULE | Freq: Two times a day (BID) | ORAL | 3 refills | Status: AC

## 2022-03-08 NOTE — Assessment & Plan Note (Signed)
Pleasant 51 year old female, we saw her back in December with knee pain in urgent care, suspected arthritis and meniscal tearing. ?Started conservative treatment including meloxicam, she could not tolerate this but never let us know. ?X-rays were for the most part unrevealing. ?[facility], an MRI was obtained that showed what sounds to be mucoid degeneration of the ACL but no other abnormalities, I do not have any images to review. ?Mucoid degeneration can be a normal finding. ?On exam she does have a good ACL endpoint. ?She has pain with terminal flexion, extension and tenderness at the joint lines. ?Today we injected her knee, I would like her to get me her MRI disc for my review. ?If this fails she will need referral for arthroscopy. ?Celebrex for pain relief in the meantime. ?

## 2022-03-08 NOTE — Progress Notes (Signed)
? ? ?  Procedures performed today:   ? ?Procedure: Real-time Ultrasound Guided injection of the left knee ?Device: Samsung HS60  ?Verbal informed consent obtained.  ?Time-out conducted.  ?Noted no overlying erythema, induration, or other signs of local infection.  ?Skin prepped in a sterile fashion.  ?Local anesthesia: Topical Ethyl chloride.  ?With sterile technique and under real time ultrasound guidance: Noted trace effusion, 1 cc Kenalog 40, 2 cc lidocaine, 2 cc bupivacaine injected easily ?Completed without difficulty  ?Advised to call if fevers/chills, erythema, induration, drainage, or persistent bleeding.  ?Images permanently stored and available for review in PACS.  ?Impression: Technically successful ultrasound guided injection. ? ?Independent interpretation of notes and tests performed by another provider:  ? ?None. ? ?Brief History, Exam, Impression, and Recommendations:   ? ?Left knee pain ?Pleasant 51 year old female, we saw her back in December with knee pain in urgent care, suspected arthritis and meniscal tearing. ?Started conservative treatment including meloxicam, she could not tolerate this but never let us know. ?X-rays were for the most part unrevealing. ?She was then seen by her [facility], an MRI was obtained that showed what sounds to be mucoid degeneration of the ACL but no other abnormalities, I do not have any images to review. ?Mucoid degeneration can be a normal finding. ?On exam she does have a good ACL endpoint. ?She has pain with terminal flexion, extension and tenderness at the joint lines. ?Today we injected her knee, I would like her to get me her MRI disc for my review. ?If this fails she will need referral for arthroscopy. ?Celebrex for pain relief in the meantime. ? ? ? ?___________________________________________ ?Ihor Austin. Benjamin Stain, M.D., ABFM., CAQSM. ?Primary Care and Sports Medicine ?[facility] ? ?Adjunct Instructor of Family Medicine   ?[employer] ?

## 2022-04-19 ENCOUNTER — Ambulatory Visit (INDEPENDENT_AMBULATORY_CARE_PROVIDER_SITE_OTHER): Payer: No Typology Code available for payment source | Admitting: Sports Medicine

## 2022-04-19 DIAGNOSIS — M25562 Pain in left knee: Secondary | ICD-10-CM | POA: Diagnosis not present

## 2022-04-19 NOTE — Progress Notes (Signed)
? ? ?  Procedures performed today:   ? ?None. ? ?Independent interpretation of notes and tests performed by another provider:  ? ?Left knee MRI from 02/01/2022:  ?1. Remarkable ACL mucoid degeneration/intraligamentous cyst formation.  ?2. Anterior intercondylar notch ganglion cyst measuring 1 cm.  ?3. Intact knee ligaments and menisci.  ? ?Brief History, Exam, Impression, and Recommendations:   ? ?Left knee pain ?This is a pleasant 51 year old female, we have seen her now for several months with knee pain, suspected arthritis and meniscal tearing, she ultimately did have an MRI at the [place] that showed mucous degeneration of the ACL though on exam she did have a good ACL endpoint, she also had some intercondylar notch ganglia. ?We did a knee injection at the last visit, she had partial relief followed by return of pain, due to her return of pain I think we should get a surgical opinion from Dr. Everardo Pacific. ? ? ? ?___________________________________________ ?Susan [name]. Susan [name], M.D., ABFM., CAQSM. ?Primary Care and Sports Medicine ?[facility] Susan [name] ? ?Adjunct Instructor of Family Medicine  ?[employer] ?

## 2022-04-19 NOTE — Assessment & Plan Note (Signed)
This is a pleasant 51 year old female, we have seen her now for several months with knee pain, suspected arthritis and meniscal tearing, she ultimately did have an MRI at the [place] that showed mucous degeneration of the ACL though on exam she did have a good ACL endpoint, she also had some intercondylar notch ganglia. ?We did a knee injection at the last visit, she had partial relief followed by return of pain, due to her return of pain I think we should get a surgical opinion from Dr. Everardo Pacific. ? ?

## 2022-04-25 ENCOUNTER — Telehealth: Payer: Self-pay | Admitting: Sports Medicine

## 2022-04-25 NOTE — Telephone Encounter (Signed)
Have her call her insurance company and find out who is in network and I will send to whoever is in network, we do not know this offhand. ?

## 2022-04-25 NOTE — Telephone Encounter (Signed)
Spoke with Patient about contacting her insurance to find whoever is in her network. Patient then asked if her records of your finding could be sent to the [place]. I explained to the patient that she would need to come by and fill out a medical release form or have the [place] send a request for those records. Patient expressed understanding.  ?

## 2022-04-25 NOTE — Telephone Encounter (Signed)
Patient called and stated her insurance is not accepted at [facility] with Dr. Everardo Pacific . Please advise.  ?

## 2024-08-28 ENCOUNTER — Encounter: Payer: Self-pay | Admitting: Sports Medicine
